# Patient Record
Sex: Male | Born: 1970 | Race: Black or African American | Hispanic: No | Marital: Single | Smoking: Never smoker
Health system: Southern US, Community
[De-identification: ages and names within clinical notes are randomized; demographics above are authoritative.]

## PROBLEM LIST (undated history)

## (undated) HISTORY — PX: WISDOM TOOTH EXTRACTION: SHX21

---

## 2018-05-12 ENCOUNTER — Encounter (HOSPITAL_BASED_OUTPATIENT_CLINIC_OR_DEPARTMENT_OTHER): Payer: Self-pay | Admitting: Emergency Medicine

## 2018-05-12 ENCOUNTER — Other Ambulatory Visit: Payer: Self-pay

## 2018-05-12 ENCOUNTER — Emergency Department (HOSPITAL_BASED_OUTPATIENT_CLINIC_OR_DEPARTMENT_OTHER): Payer: No Typology Code available for payment source

## 2018-05-12 ENCOUNTER — Emergency Department (HOSPITAL_BASED_OUTPATIENT_CLINIC_OR_DEPARTMENT_OTHER)
Admission: EM | Admit: 2018-05-12 | Discharge: 2018-05-13 | Disposition: A | Discharge status: Home / Self Care | Payer: No Typology Code available for payment source | Attending: Emergency Medicine | Admitting: Emergency Medicine

## 2018-05-12 DIAGNOSIS — J189 Pneumonia, unspecified organism: Secondary | ICD-10-CM

## 2018-05-12 DIAGNOSIS — J029 Acute pharyngitis, unspecified: Secondary | ICD-10-CM | POA: Insufficient documentation

## 2018-05-12 DIAGNOSIS — J181 Lobar pneumonia, unspecified organism: Secondary | ICD-10-CM | POA: Insufficient documentation

## 2018-05-12 DIAGNOSIS — R509 Fever, unspecified: Secondary | ICD-10-CM | POA: Diagnosis not present

## 2018-05-12 LAB — RAPID STREP SCREEN (MED CTR MEBANE ONLY): Streptococcus, Group A Screen (Direct): NEGATIVE

## 2018-05-12 MED ORDER — AZITHROMYCIN 250 MG PO TABS: 250.0000 mg | ORAL_TABLET | Freq: Every day | ORAL | 0 refills | Status: AC

## 2018-05-12 MED ORDER — AZITHROMYCIN 250 MG PO TABS
500.0000 mg | ORAL_TABLET | Freq: Once | ORAL | Status: AC
Start: 2018-05-12 — End: 2018-05-13
  Administered 2018-05-13: 500 mg via ORAL
  Filled 2018-05-12: qty 2

## 2018-05-12 MED ORDER — IBUPROFEN 400 MG PO TABS
600.0000 mg | ORAL_TABLET | Freq: Once | ORAL | Status: AC
  Administered 2018-05-13: 600 mg via ORAL
  Filled 2018-05-12: qty 1

## 2018-05-12 NOTE — ED Triage Notes (Signed)
Pt c/o sore throat x today; np cough x 2 wks

## 2018-05-13 NOTE — ED Notes (Signed)
Pt discharged to home with family. NAD.  

## 2018-05-15 LAB — CULTURE, GROUP A STREP (THRC)

## 2018-05-20 NOTE — ED Provider Notes (Signed)
[facility] EMERGENCY DEPARTMENT Provider Note   CSN: [id number] Arrival date & time: 05/12/18  2015     History   Chief Complaint Chief Complaint  Patient presents with  . Sore Throat  . Cough    HPI Lawrence Pacheco is a 47 y.o. male.  HPI   47 year old male with fever, cough and sore throat.  He has been dealing with a nonproductive cough for about 2 weeks.  Today he developed a mild sore throat and also subjective fever.  Of note, his 69-year-old son is also a patient emergency room and is febrile as well.  Patient denies any chest pain.  No urinary complaints.  He does not feel short of breath.  No unusual leg pain or swelling.  History reviewed. No pertinent past medical history.  There are no active problems to display for this patient.   Past Surgical History:  Procedure Laterality Date  . WISDOM TOOTH EXTRACTION          Home Medications    Prior to Admission medications   Medication Sig Start Date End Date Taking? Authorizing Provider  azithromycin (ZITHROMAX) 250 MG tablet Take 1 tablet (250 mg total) by mouth daily. Take first 2 tablets together, then 1 every day until finished. 05/12/18   Raeford Razor, MD    Family History No family history on file.  Social History Social History   Tobacco Use  . Smoking status: Never Smoker  . Smokeless tobacco: Never Used  Substance Use Topics  . Alcohol use: Not on file  . Drug use: Not on file     Allergies   Patient has no known allergies.   Review of Systems Review of Systems  All systems reviewed and negative, other than as noted in HPI.  Physical Exam Updated Vital Signs BP (!) 131/95 (BP Location: Right Arm)   Pulse 97   Temp (!) 100.8 F (38.2 C) (Oral)   Resp 20   Ht 5\' 11"  (1.803 m)   Wt 106.6 kg (235 lb)   SpO2 95%   BMI 32.78 kg/m   Physical Exam  Constitutional: He appears well-developed and well-nourished. No distress.  HENT:  Head: Normocephalic and atraumatic.    Nose: Nose normal.  Mouth/Throat: Oropharynx is clear and moist. No oropharyngeal exudate.  Eyes: Conjunctivae are normal. Right eye exhibits no discharge. Left eye exhibits no discharge.  Neck: Normal range of motion. Neck supple.  Cardiovascular: Normal rate, regular rhythm and normal heart sounds. Exam reveals no gallop and no friction rub.  No murmur heard. Pulmonary/Chest: Effort normal and breath sounds normal. No respiratory distress.  Abdominal: Soft. He exhibits no distension. There is no tenderness.  Musculoskeletal: He exhibits no edema or tenderness.  Lower extremities symmetric as compared to each other. No calf tenderness. Negative Homan's. No palpable cords.   Lymphadenopathy:    He has no cervical adenopathy.  Neurological: He is alert.  Skin: Skin is warm and dry.  Psychiatric: He has a normal mood and affect. His behavior is normal. Thought content normal.  Nursing note and vitals reviewed.    ED Treatments / Results  Labs (all labs ordered are listed, but only abnormal results are displayed) Labs Reviewed  RAPID STREP SCREEN ([facility] ONLY)  CULTURE, GROUP A STREP [facility])    EKG None  Radiology No results found.   Dg Chest 2 View  Result Date: 05/12/2018 CLINICAL DATA:  Fever and cough EXAM: CHEST - 2 VIEW COMPARISON:  None. FINDINGS: Small streaky atelectasis or minimal infiltrate at the left base. No pleural effusion. Normal heart size. No pneumothorax IMPRESSION: Minimal streaky atelectasis or infiltrate at the left lung base. Electronically Signed   By: Jasmine PangKim  Fujinaga M.D.   On: 05/12/2018 23:17    Procedures Procedures (including critical care time)  Medications Ordered in ED Medications  ibuprofen (ADVIL,MOTRIN) tablet 600 mg (600 mg Oral Given 05/13/18 0001)  azithromycin (ZITHROMAX) tablet 500 mg (500 mg Oral Given 05/13/18 0002)     Initial Impression / Assessment and Plan / ED Course  I have reviewed the triage vital signs and  the nursing notes.  Pertinent labs & imaging results that were available during my care of the patient were reviewed by me and considered in my medical decision making (see chart for details).     47 year old male with cough, fever and sore throat.  Chest x-ray does show a infiltrate in the left lung base.  Based on the symptoms, will treat for community acquired pneumonia.  His oropharynx is clear.  Oxygen saturations are normal on room air.  His work of breathing is not simply increase.  Feel is appropriate for outpatient treatment.  Return precautions discussed.  Final Clinical Impressions(s) / ED Diagnoses   Final diagnoses:  Community acquired pneumonia of left lower lobe of lung [facility](HCC)    ED Discharge Orders        Ordered    azithromycin (ZITHROMAX) 250 MG tablet  Daily     05/12/18 2338       Raeford RazorKohut, Lyvonne Cassell, MD 05/20/18 [id number]

## 2019-10-10 IMAGING — DX DG CHEST 2V
2 series · 2 of 2 positions shown · non-contrast
Comparison: None.

CLINICAL DATA: Fever and cough

EXAM:
CHEST - 2 VIEW

[chest pa]
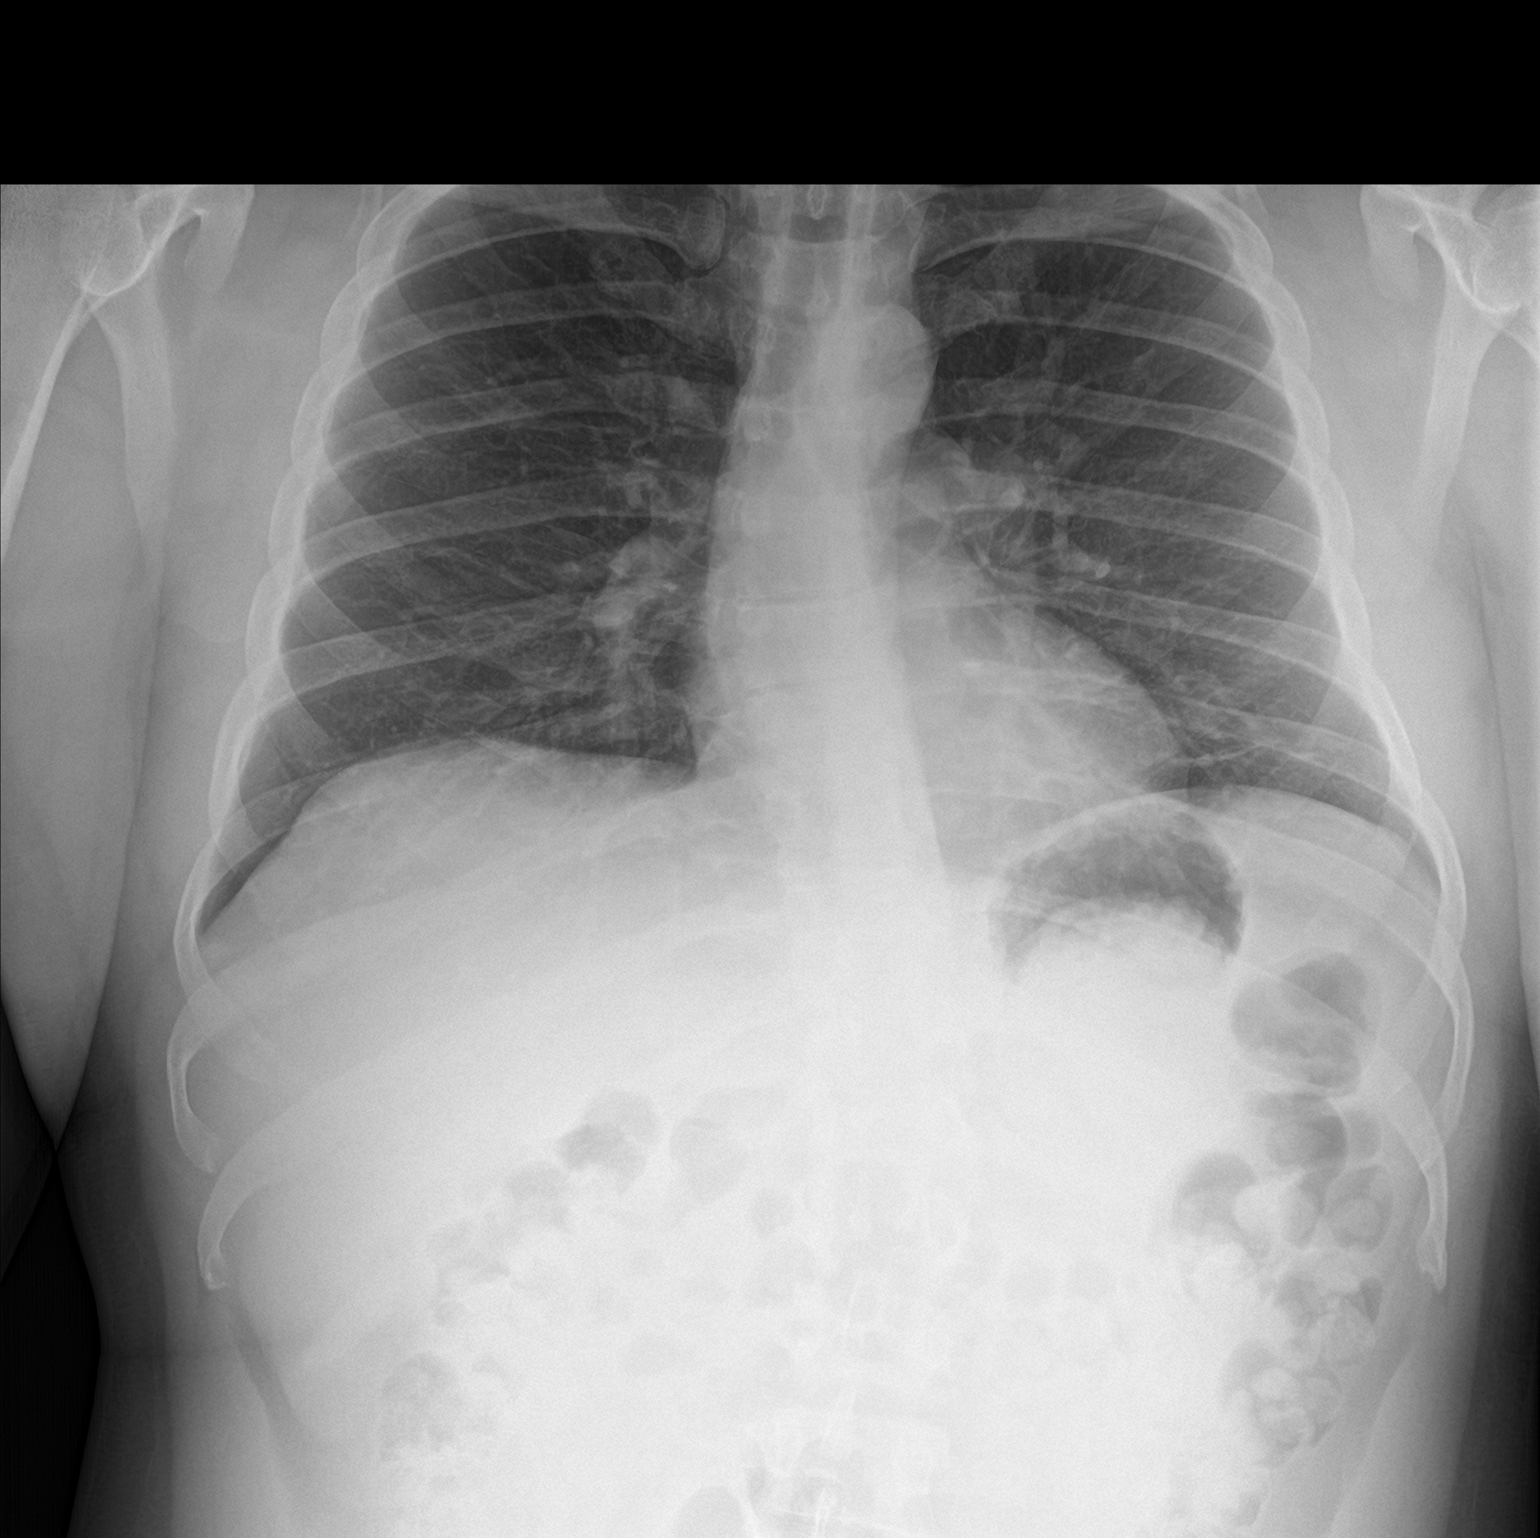

[chest lat]
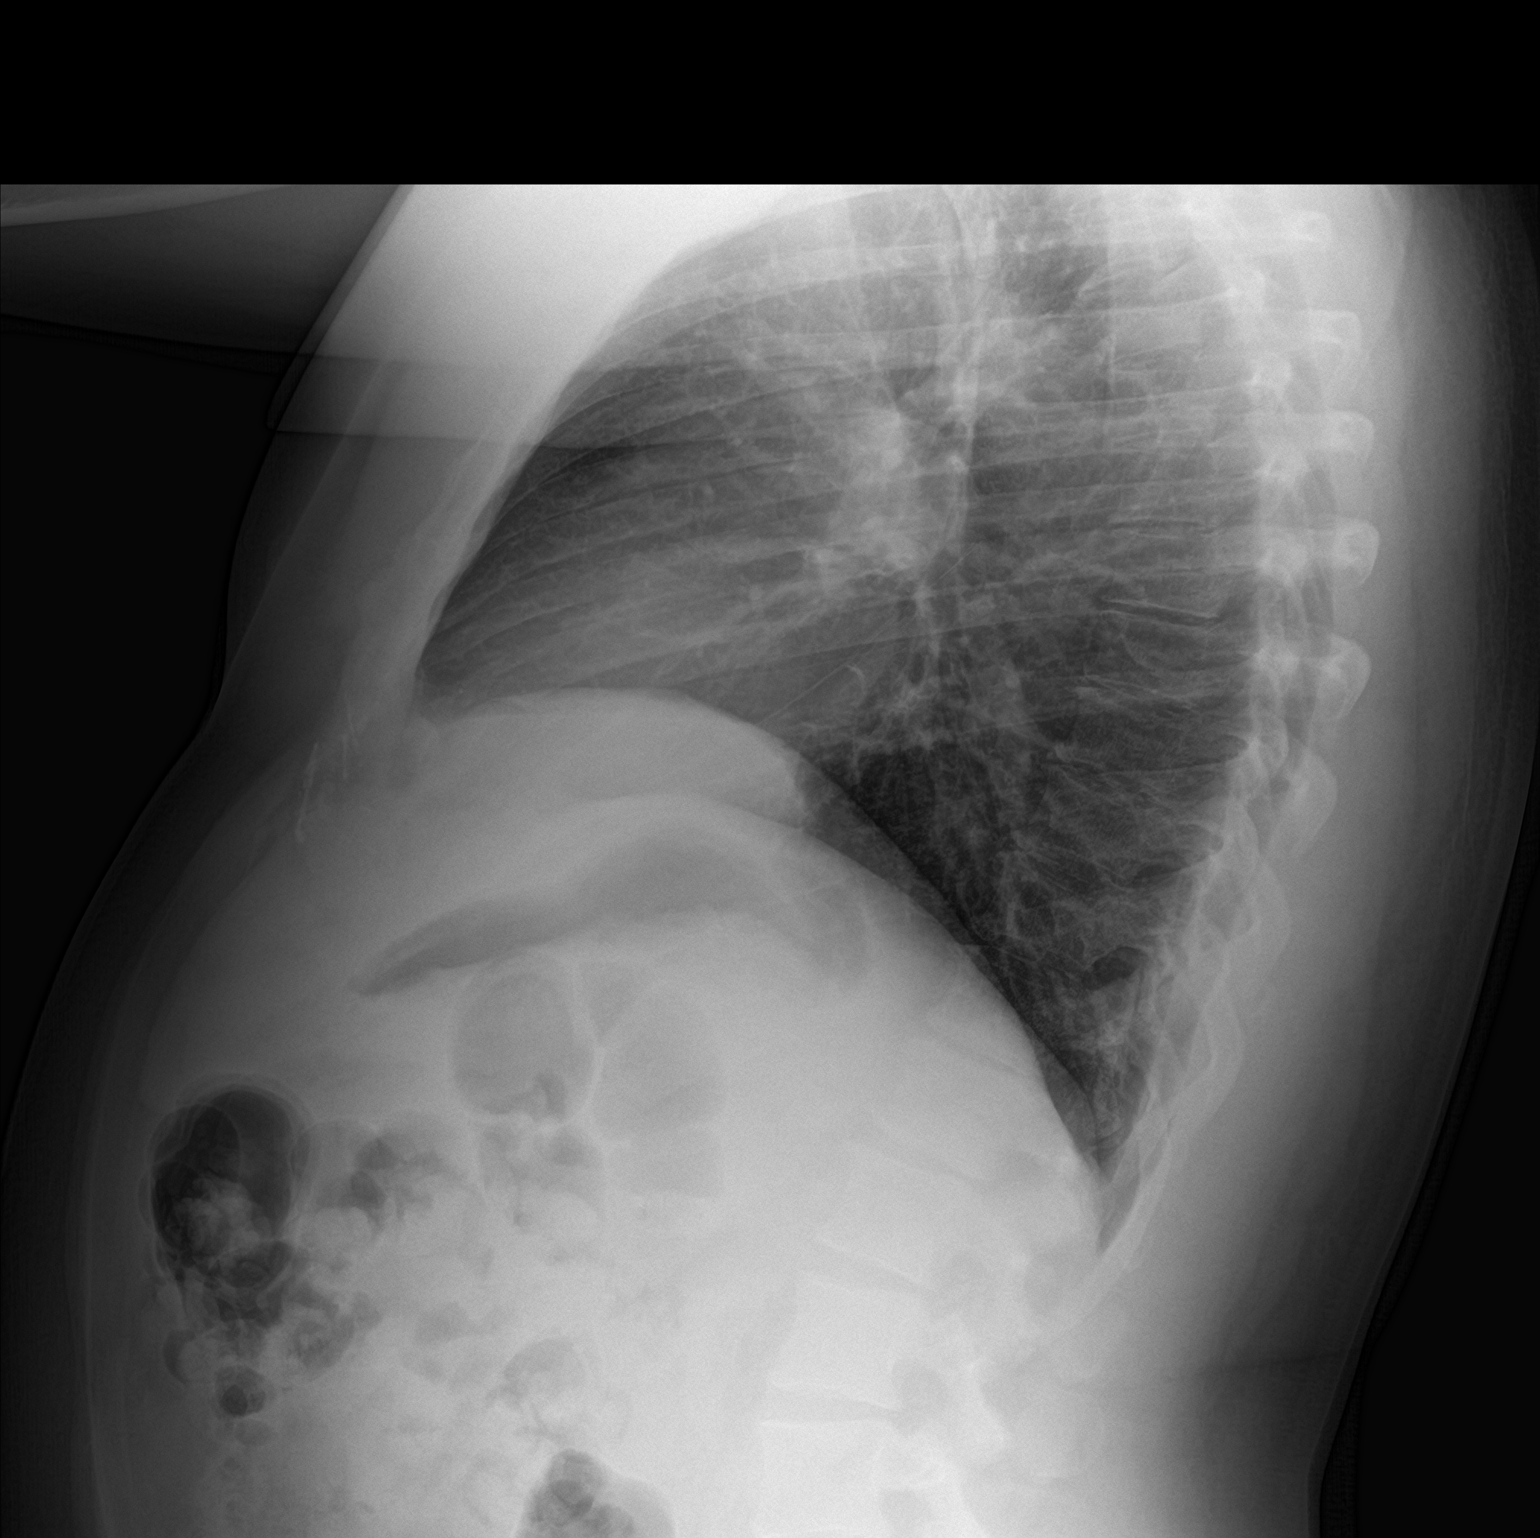

[2 of 2 positions shown; findings below may reference images not displayed]

FINDINGS: Small streaky atelectasis or minimal infiltrate at the left base. No
pleural effusion. Normal heart size. No pneumothorax
IMPRESSION: Minimal streaky atelectasis or infiltrate at the left lung base.
